# Patient Record
Sex: Female | Born: 1970 | Race: Black or African American | Hispanic: No | Smoking: Never smoker
Health system: Southern US, Community
[De-identification: ages and names within clinical notes are randomized; demographics above are authoritative.]

## PROBLEM LIST (undated history)

## (undated) DIAGNOSIS — I10 Essential (primary) hypertension: Secondary | ICD-10-CM

## (undated) DIAGNOSIS — T7840XA Allergy, unspecified, initial encounter: Secondary | ICD-10-CM

## (undated) HISTORY — PX: TUBAL LIGATION: SHX77

## (undated) HISTORY — DX: Allergy, unspecified, initial encounter: T78.40XA

## (undated) HISTORY — DX: Essential (primary) hypertension: I10

---

## 1997-11-20 ENCOUNTER — Ambulatory Visit (HOSPITAL_COMMUNITY): Admission: RE | Admit: 1997-11-20 | Discharge: 1997-11-20 | Payer: Self-pay | Admitting: *Deleted

## 1998-11-19 ENCOUNTER — Other Ambulatory Visit: Admission: RE | Admit: 1998-11-19 | Discharge: 1998-11-19 | Payer: Self-pay | Admitting: *Deleted

## 2000-02-04 ENCOUNTER — Other Ambulatory Visit: Admission: RE | Admit: 2000-02-04 | Discharge: 2000-02-04 | Payer: Self-pay | Admitting: *Deleted

## 2001-06-04 ENCOUNTER — Emergency Department (HOSPITAL_COMMUNITY): Admission: EM | Admit: 2001-06-04 | Discharge: 2001-06-04 | Payer: Self-pay | Admitting: Podiatry

## 2002-05-22 ENCOUNTER — Other Ambulatory Visit: Admission: RE | Admit: 2002-05-22 | Discharge: 2002-05-22 | Payer: Self-pay | Admitting: *Deleted

## 2003-01-27 ENCOUNTER — Encounter: Payer: Self-pay | Admitting: Internal Medicine

## 2003-12-09 ENCOUNTER — Ambulatory Visit: Payer: Self-pay | Admitting: Internal Medicine

## 2003-12-29 ENCOUNTER — Ambulatory Visit: Payer: Self-pay | Admitting: Internal Medicine

## 2004-02-04 ENCOUNTER — Other Ambulatory Visit: Admission: RE | Admit: 2004-02-04 | Discharge: 2004-02-04 | Payer: Self-pay | Admitting: Obstetrics and Gynecology

## 2005-03-10 ENCOUNTER — Other Ambulatory Visit: Admission: RE | Admit: 2005-03-10 | Discharge: 2005-03-10 | Payer: Self-pay | Admitting: Obstetrics and Gynecology

## 2005-03-31 ENCOUNTER — Encounter: Admission: RE | Admit: 2005-03-31 | Discharge: 2005-03-31 | Payer: Self-pay | Admitting: Obstetrics and Gynecology

## 2005-05-27 ENCOUNTER — Ambulatory Visit: Payer: Self-pay | Admitting: Internal Medicine

## 2006-06-20 ENCOUNTER — Encounter: Admission: RE | Admit: 2006-06-20 | Discharge: 2006-06-20 | Payer: Self-pay | Admitting: Physician Assistant

## 2006-07-14 ENCOUNTER — Other Ambulatory Visit: Admission: RE | Admit: 2006-07-14 | Discharge: 2006-07-14 | Payer: Self-pay | Admitting: Family Medicine

## 2006-12-05 ENCOUNTER — Encounter: Payer: Self-pay | Admitting: Internal Medicine

## 2006-12-05 DIAGNOSIS — J329 Chronic sinusitis, unspecified: Secondary | ICD-10-CM | POA: Insufficient documentation

## 2007-04-16 ENCOUNTER — Encounter: Admission: RE | Admit: 2007-04-16 | Discharge: 2007-04-16 | Payer: Self-pay | Admitting: Physician Assistant

## 2008-04-16 ENCOUNTER — Ambulatory Visit (HOSPITAL_COMMUNITY): Admission: RE | Admit: 2008-04-16 | Discharge: 2008-04-16 | Payer: Self-pay | Admitting: Family Medicine

## 2009-05-01 ENCOUNTER — Encounter: Admission: RE | Admit: 2009-05-01 | Discharge: 2009-05-01 | Payer: Self-pay | Admitting: Obstetrics & Gynecology

## 2009-07-30 ENCOUNTER — Emergency Department (HOSPITAL_COMMUNITY): Admission: EM | Admit: 2009-07-30 | Discharge: 2009-07-30 | Payer: Self-pay | Admitting: Emergency Medicine

## 2010-02-14 ENCOUNTER — Encounter: Payer: Self-pay | Admitting: Obstetrics and Gynecology

## 2010-03-30 ENCOUNTER — Other Ambulatory Visit: Payer: Self-pay | Admitting: Family Medicine

## 2010-03-30 DIAGNOSIS — Z1231 Encounter for screening mammogram for malignant neoplasm of breast: Secondary | ICD-10-CM

## 2010-04-11 LAB — URINALYSIS, ROUTINE W REFLEX MICROSCOPIC
Bilirubin Urine: NEGATIVE
Glucose, UA: NEGATIVE mg/dL
Ketones, ur: NEGATIVE mg/dL
Nitrite: NEGATIVE
Protein, ur: NEGATIVE mg/dL
Specific Gravity, Urine: 1.018 (ref 1.005–1.030)
Urobilinogen, UA: 0.2 mg/dL (ref 0.0–1.0)
pH: 6 (ref 5.0–8.0)

## 2010-04-11 LAB — POCT PREGNANCY, URINE: Preg Test, Ur: NEGATIVE

## 2010-04-11 LAB — URINE MICROSCOPIC-ADD ON

## 2010-05-13 ENCOUNTER — Ambulatory Visit
Admission: RE | Admit: 2010-05-13 | Discharge: 2010-05-13 | Disposition: A | Discharge status: Home / Self Care | Payer: BC Managed Care – PPO | Source: Ambulatory Visit | Attending: Family Medicine | Admitting: Family Medicine

## 2010-05-13 DIAGNOSIS — Z1231 Encounter for screening mammogram for malignant neoplasm of breast: Secondary | ICD-10-CM

## 2011-01-11 ENCOUNTER — Other Ambulatory Visit: Payer: Self-pay | Admitting: Physician Assistant

## 2011-01-11 ENCOUNTER — Other Ambulatory Visit (HOSPITAL_COMMUNITY)
Admission: RE | Admit: 2011-01-11 | Discharge: 2011-01-11 | Disposition: A | Discharge status: Home / Self Care | Payer: BC Managed Care – PPO | Source: Ambulatory Visit | Attending: Family Medicine | Admitting: Family Medicine

## 2011-01-11 DIAGNOSIS — Z01419 Encounter for gynecological examination (general) (routine) without abnormal findings: Secondary | ICD-10-CM | POA: Insufficient documentation

## 2011-07-02 IMAGING — CR DG LUMBAR SPINE COMPLETE 4+V
5 series · 5 of 5 positions shown · non-contrast
Comparison: None.

CLINICAL DATA: Low back pain secondary to a fall down stairs.

LUMBAR SPINE - COMPLETE 4+ VIEW

[t l-spine a.p.]
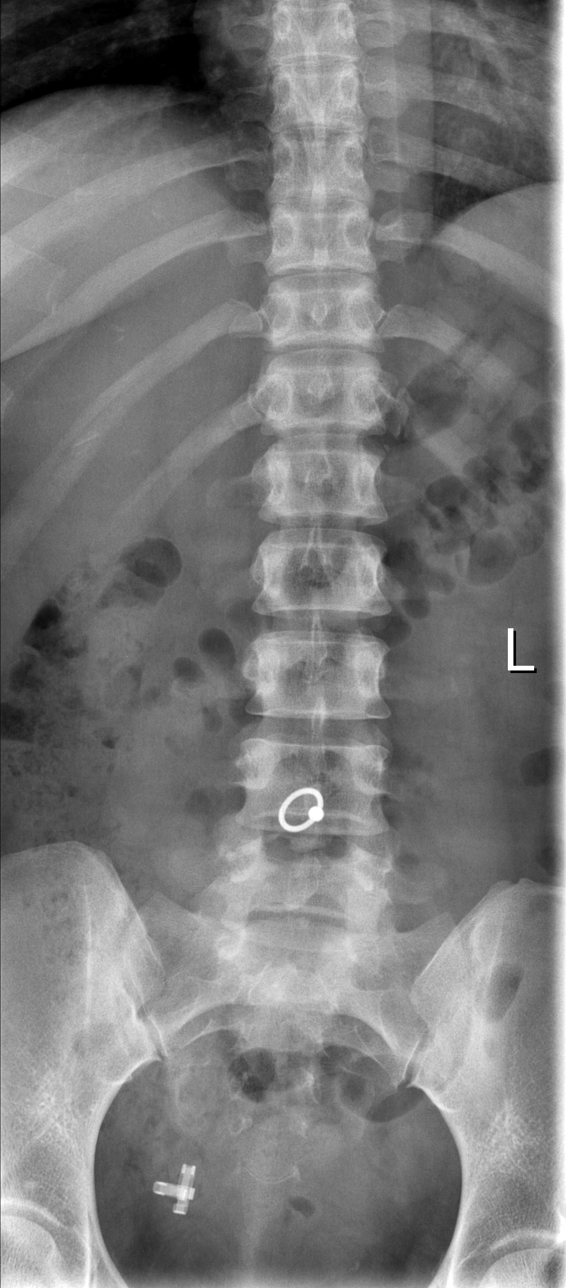

[t l-spine oblique exposure (1 of 2)]
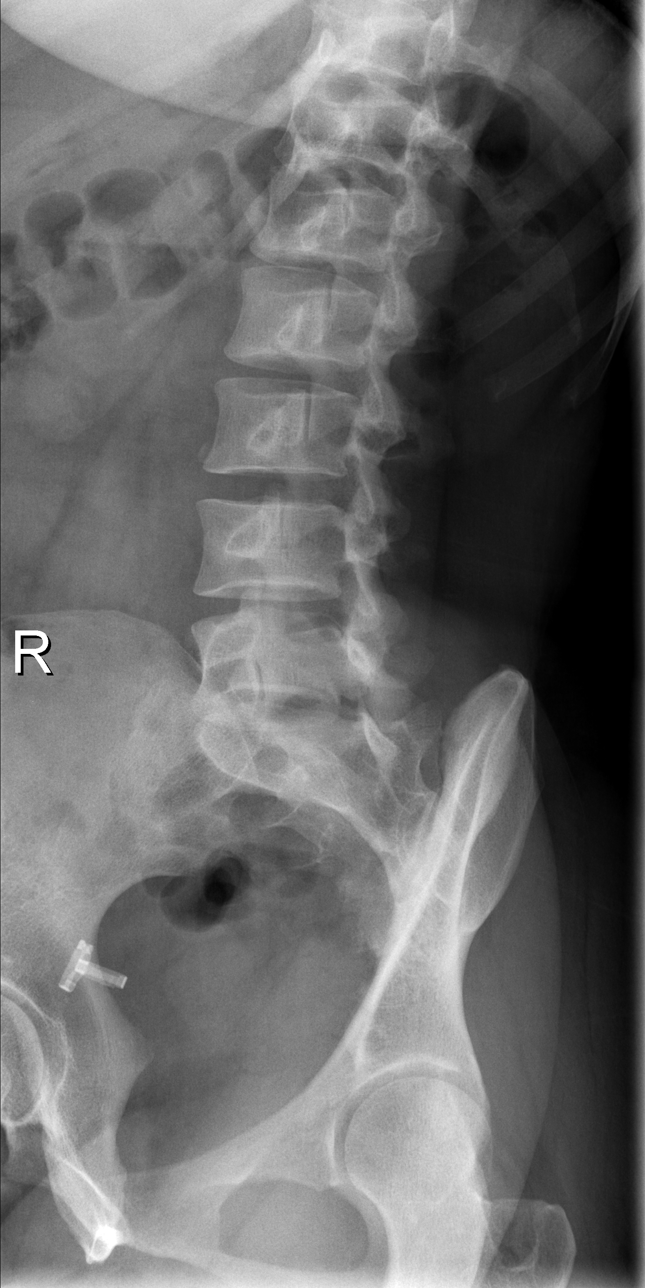

[t l-spine oblique exposure (2 of 2)]
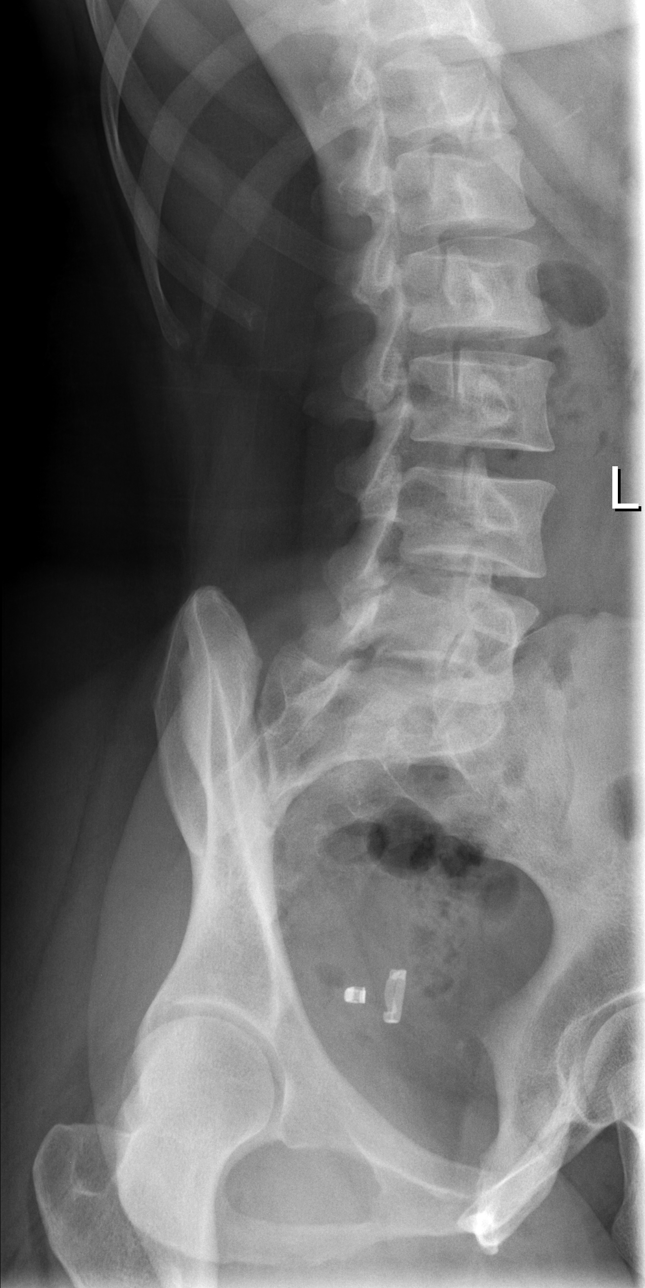

[t l-spine lat]
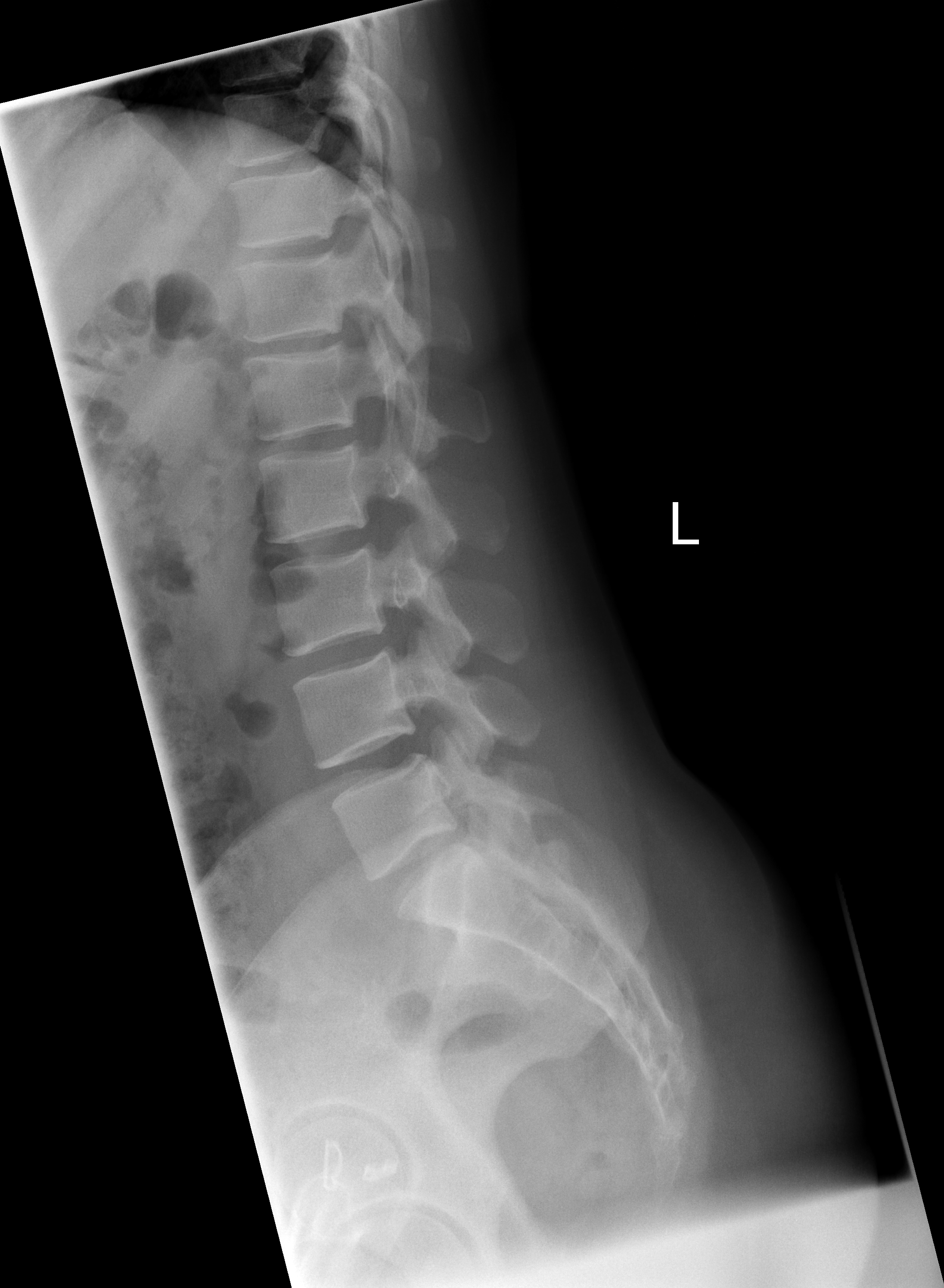

[t l-spine l5-s1 spot]
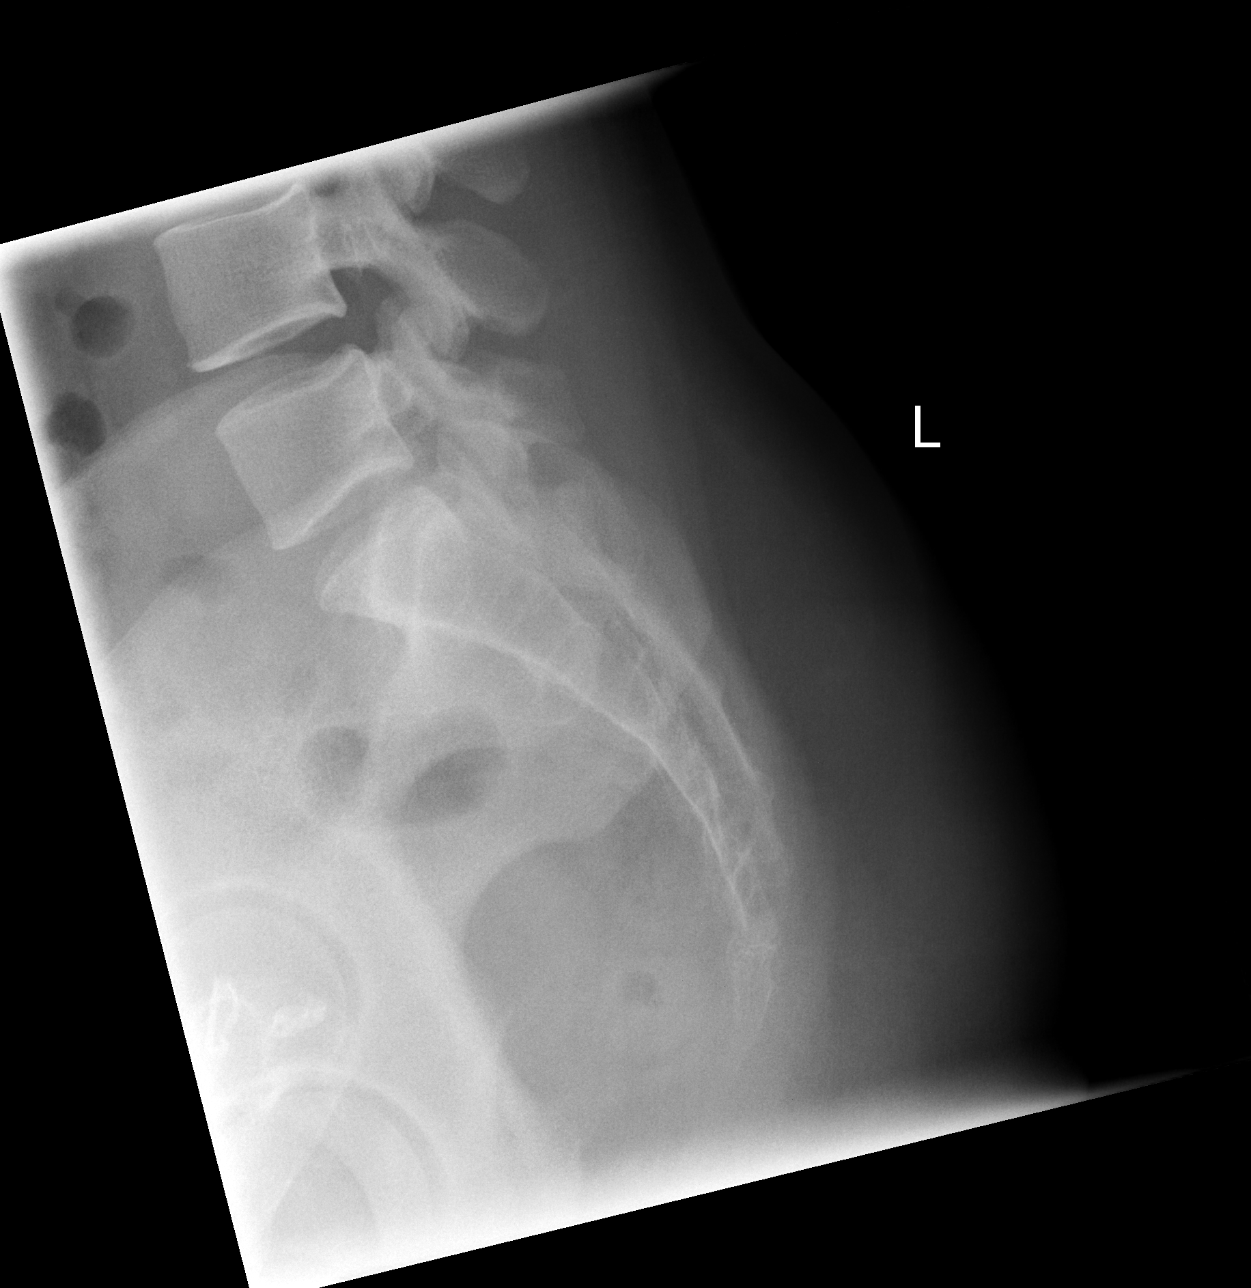

[5 of 5 positions shown; findings below may reference images not displayed]

FINDINGS: There is no fracture, disc space narrowing, facet
disease, or other significant abnormalities.
IMPRESSION: Normal lumbar spine.

## 2011-07-02 IMAGING — CR DG THORACIC SPINE 2V
3 series · 3 of 3 positions shown · non-contrast
Comparison: None.

CLINICAL DATA: Back pain secondary to falling down stairs.

THORACIC SPINE - 2 VIEW

[t t-spine a.p.]
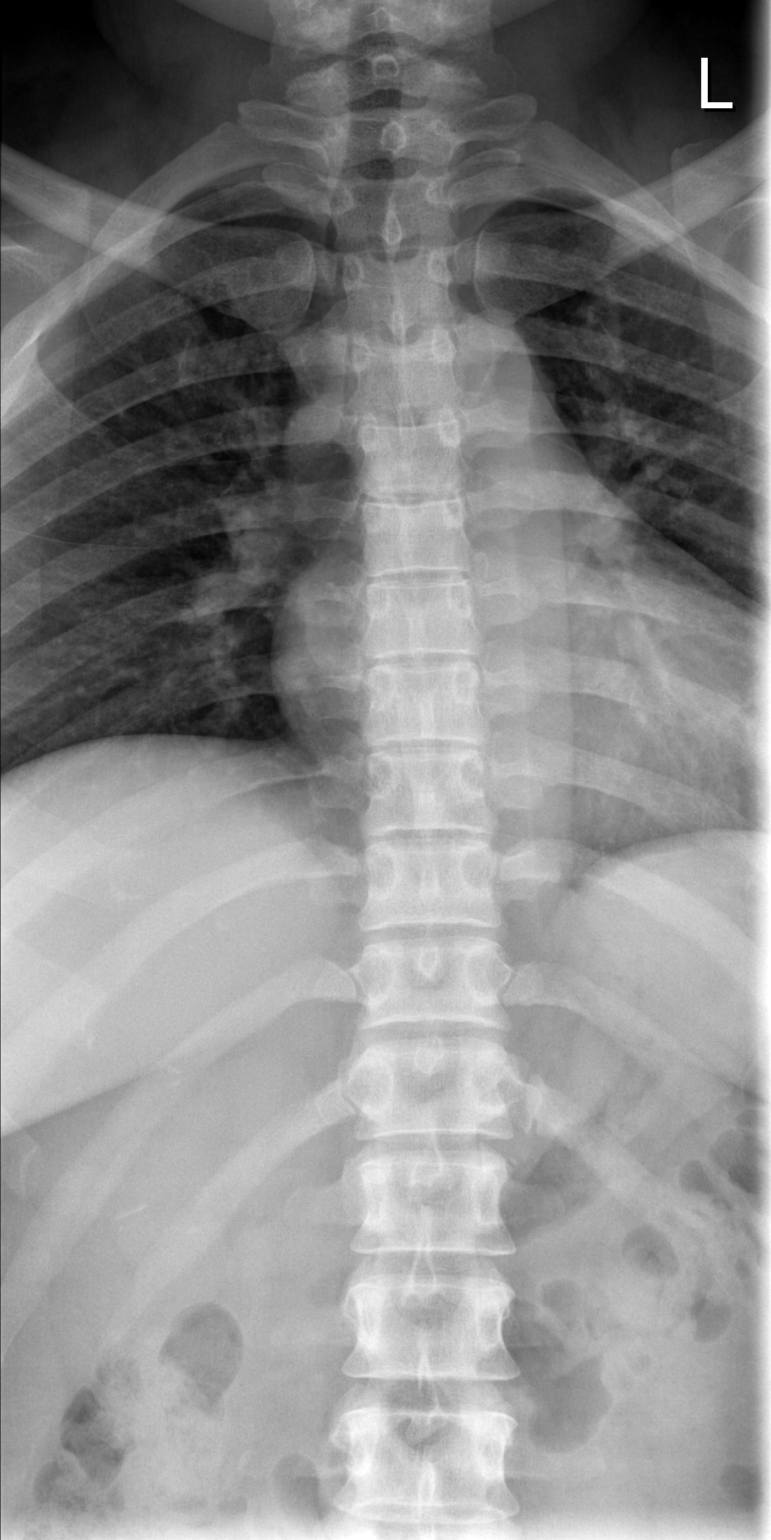

[t t-spine lat]
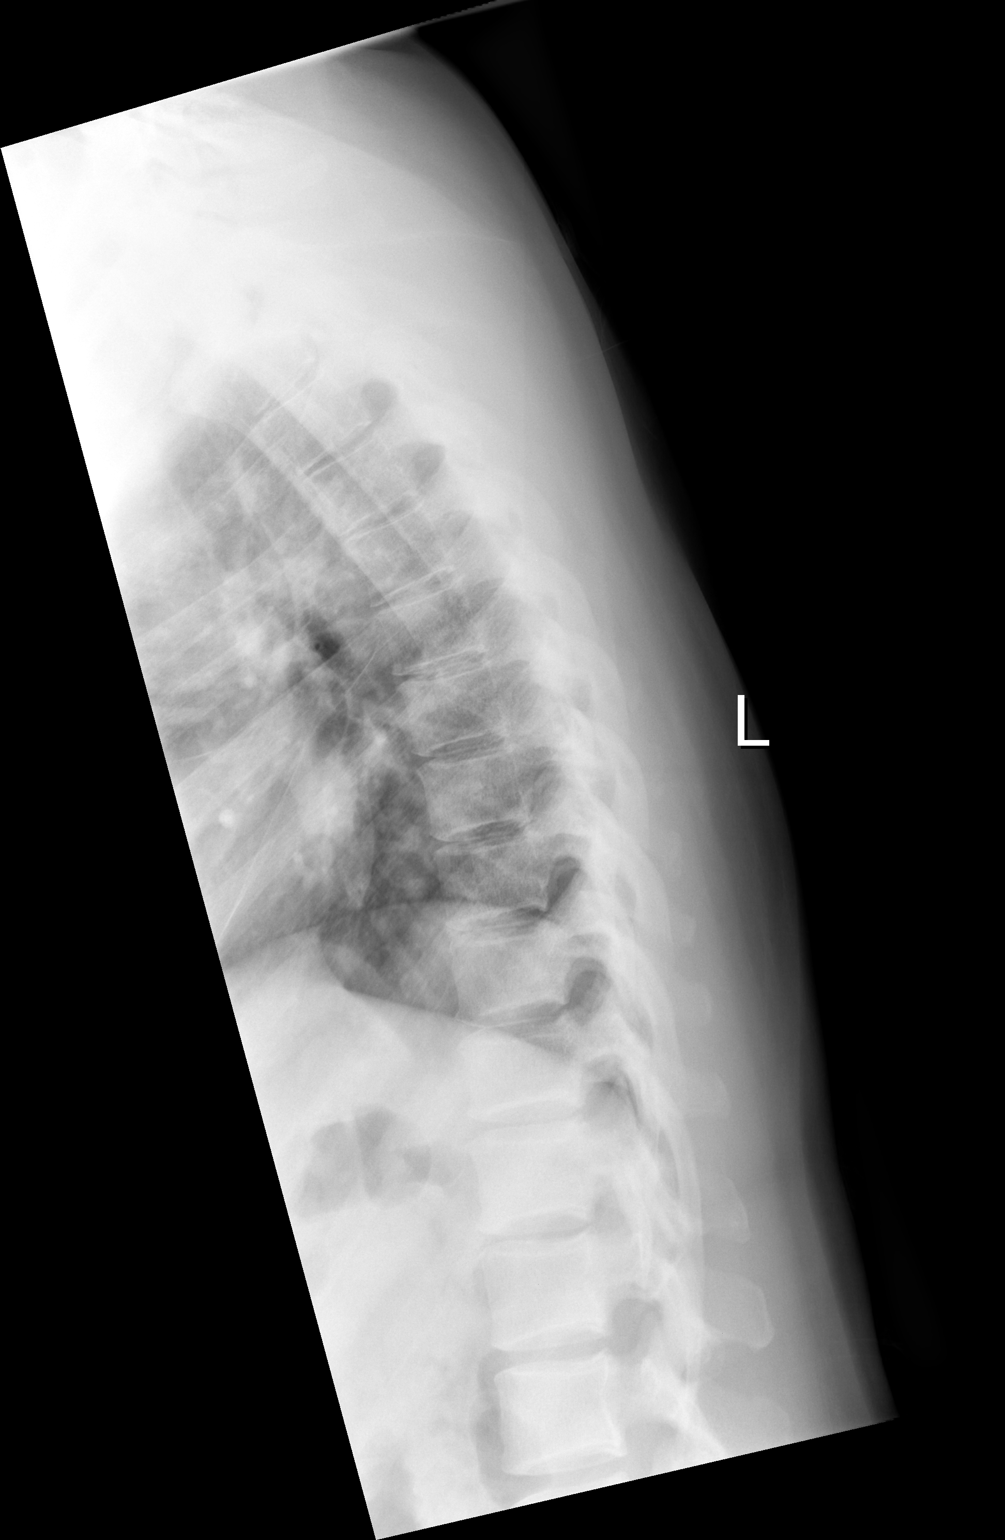

[t swimmers]
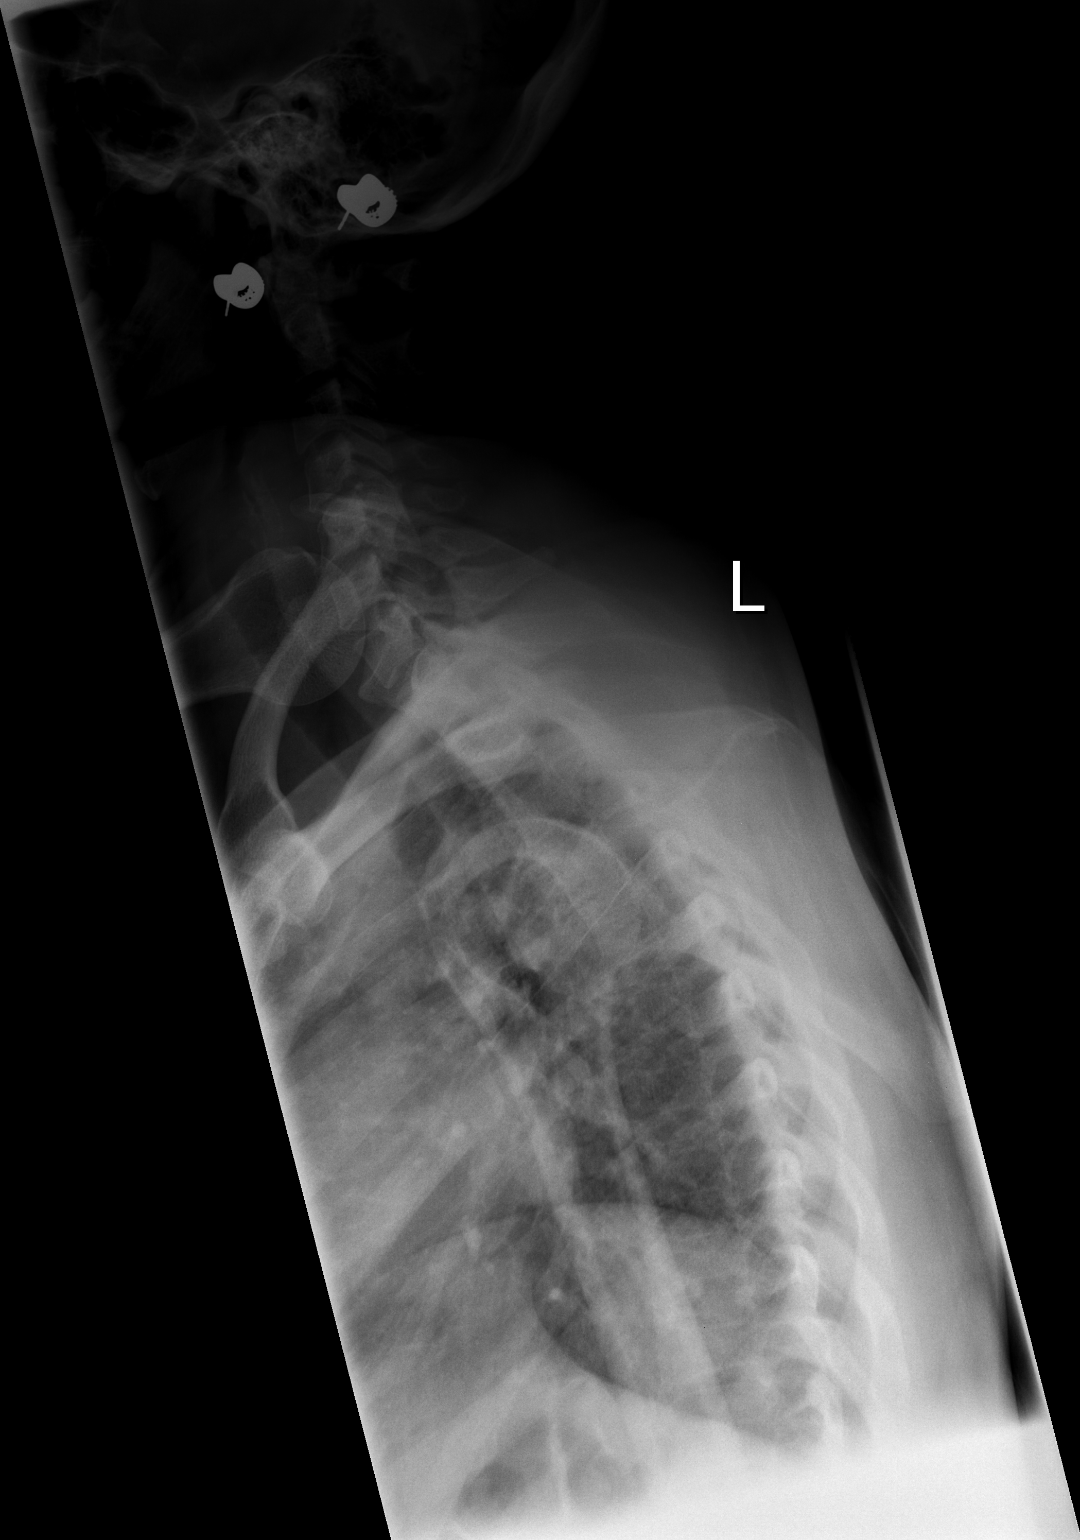

[3 of 3 positions shown; findings below may reference images not displayed]

FINDINGS: There is no fracture, subluxation, disc space narrowing,
or other significant abnormality.
IMPRESSION: Normal exam.

## 2012-04-18 ENCOUNTER — Other Ambulatory Visit: Payer: Self-pay | Admitting: Physician Assistant

## 2012-04-18 ENCOUNTER — Other Ambulatory Visit (HOSPITAL_COMMUNITY)
Admission: RE | Admit: 2012-04-18 | Discharge: 2012-04-18 | Disposition: A | Discharge status: Home / Self Care | Payer: BC Managed Care – PPO | Source: Ambulatory Visit | Attending: Family Medicine | Admitting: Family Medicine

## 2012-04-18 DIAGNOSIS — Z124 Encounter for screening for malignant neoplasm of cervix: Secondary | ICD-10-CM | POA: Insufficient documentation

## 2012-10-30 ENCOUNTER — Other Ambulatory Visit: Payer: Self-pay

## 2012-10-30 DIAGNOSIS — Z1231 Encounter for screening mammogram for malignant neoplasm of breast: Secondary | ICD-10-CM

## 2012-11-16 ENCOUNTER — Ambulatory Visit: Payer: BC Managed Care – PPO

## 2013-03-05 ENCOUNTER — Ambulatory Visit: Payer: BC Managed Care – PPO | Admitting: Emergency Medicine

## 2013-03-05 VITALS — BP 132/82 | HR 74 | Temp 99.2°F | Resp 17 | Ht 59.5 in | Wt 140.0 lb

## 2013-03-05 DIAGNOSIS — B9789 Other viral agents as the cause of diseases classified elsewhere: Secondary | ICD-10-CM

## 2013-03-05 DIAGNOSIS — J209 Acute bronchitis, unspecified: Secondary | ICD-10-CM

## 2013-03-05 DIAGNOSIS — B349 Viral infection, unspecified: Secondary | ICD-10-CM

## 2013-03-05 MED ORDER — GUAIFENESIN-CODEINE 100-10 MG/5ML PO SOLN: 10.0000 mL | Freq: Four times a day (QID) | ORAL | Status: DC | PRN

## 2013-03-05 NOTE — Progress Notes (Signed)
   Subjective:    Patient ID: Tammy Martinez, female    DOB: 08-Mar-1970, 43 y.o.   MRN: [id number]  HPI Cough and chest congestion for past 4 days.  Also with sore throat and "lost voice".  Cough productive of greenish sputum.  Positive runny nose and generalized fatigue.  No chest pain.  No nausea or vomiting.  No abdominal pain.  PPMH:  Sinusitis and Hypertension  SH:  Nonsmoker, no alcohol.   Review of Systems  Constitutional: Negative for fever and chills.  HENT: Positive for postnasal drip, rhinorrhea, sore throat and voice change.   Respiratory: Positive for cough. Negative for chest tightness, shortness of breath and wheezing.   Cardiovascular: Negative for chest pain and leg swelling.  Gastrointestinal: Negative for nausea, vomiting, diarrhea and constipation.       Objective:   Physical Exam Blood pressure 132/82, pulse 74, temperature 99.2 F (37.3 C), temperature source Oral, resp. rate 17, height 4' 11.5" (1.511 m), weight 140 lb (63.504 kg), last menstrual period 02/12/2013, SpO2 99.00%. Body mass index is 27.81 kg/(m^2). Well-developed, well nourished female who is awake, alert and oriented, in NAD. HEENT: [place]/AT, PERRL, EOMI.  Sclera and conjunctiva are clear.  EAC are patent, TMs are normal in appearance. Nasal mucosa is edematous. OP is erythematous. Neck: supple, non-tender, no lymphadenopathy, thyromegaly. Heart: RRR, no murmur Lungs: normal effort, CTA Abdomen: normo-active bowel sounds, supple, non-tender, no mass or organomegaly. Extremities: no cyanosis, clubbing or edema. Skin: warm and dry without rash. Psychologic: good mood and appropriate affect, normal speech and behavior.      Assessment & Plan:  Viral URI  Treat with codeine cough syrup.  Follow up as needed.  Patient declined work excuse.

## 2013-04-19 ENCOUNTER — Other Ambulatory Visit (HOSPITAL_COMMUNITY)
Admission: RE | Admit: 2013-04-19 | Discharge: 2013-04-19 | Disposition: A | Discharge status: Home / Self Care | Payer: BC Managed Care – PPO | Source: Ambulatory Visit | Attending: Physician Assistant | Admitting: Physician Assistant

## 2013-04-19 DIAGNOSIS — Z1151 Encounter for screening for human papillomavirus (HPV): Secondary | ICD-10-CM | POA: Insufficient documentation

## 2013-04-19 DIAGNOSIS — Z124 Encounter for screening for malignant neoplasm of cervix: Secondary | ICD-10-CM | POA: Insufficient documentation

## 2013-04-22 ENCOUNTER — Other Ambulatory Visit: Payer: Self-pay | Admitting: Physician Assistant

## 2013-05-16 ENCOUNTER — Ambulatory Visit
Admission: RE | Admit: 2013-05-16 | Discharge: 2013-05-16 | Disposition: A | Discharge status: Home / Self Care | Payer: Self-pay | Source: Ambulatory Visit

## 2013-05-16 ENCOUNTER — Encounter (INDEPENDENT_AMBULATORY_CARE_PROVIDER_SITE_OTHER): Payer: Self-pay

## 2013-05-16 DIAGNOSIS — Z1231 Encounter for screening mammogram for malignant neoplasm of breast: Secondary | ICD-10-CM

## 2014-10-20 ENCOUNTER — Ambulatory Visit (INDEPENDENT_AMBULATORY_CARE_PROVIDER_SITE_OTHER): Payer: BLUE CROSS/BLUE SHIELD | Admitting: Physician Assistant

## 2014-10-20 VITALS — BP 148/78 | HR 80 | Temp 98.9°F | Resp 16 | Ht 59.5 in | Wt 128.5 lb

## 2014-10-20 DIAGNOSIS — Q829 Congenital malformation of skin, unspecified: Secondary | ICD-10-CM | POA: Diagnosis not present

## 2014-10-20 DIAGNOSIS — L858 Other specified epidermal thickening: Secondary | ICD-10-CM

## 2014-10-20 DIAGNOSIS — R030 Elevated blood-pressure reading, without diagnosis of hypertension: Secondary | ICD-10-CM

## 2014-10-20 DIAGNOSIS — IMO0001 Reserved for inherently not codable concepts without codable children: Secondary | ICD-10-CM

## 2014-10-20 NOTE — Progress Notes (Signed)
   10/20/2014 at 7:52 PM  Tammy Martinez / DOB: [birth year] / MRN: [id number]  The patient has SINUSITIS on her problem list.  SUBJECTIVE  Tammy Martinez is a 44 y.o. well appearing female presenting for the chief complaint of rash on her upper arms and upper back.  Reports that she noticed this 3 days ago and states that it is mildly itchy at time.  She has never had this problem before.  She reports that she just started a new exercise regimen and does not shower after exercise sessions because she does this on her lunch break.     She reports a history of HTN and is taking 20 mg lisinopril daily.    She  has a past medical history of Allergy and Hypertension.    Medications reviewed and updated by myself where necessary, and exist elsewhere in the encounter.   Ms. Martinez has No Known Allergies. She  reports that she has never smoked. She has never used smokeless tobacco. She reports that she does not drink alcohol or use illicit drugs. She  reports that she does not engage in sexual activity. The patient  has past surgical history that includes Tubal ligation.  Her family history includes Breast cancer in her mother; Hyperlipidemia in her brother, father, and mother; Hypertension in her brother, father, and mother.  Review of Systems  Constitutional: Negative for fever and chills.  Respiratory: Negative for cough.   Cardiovascular: Negative for chest pain.  Gastrointestinal: Negative for nausea.  Genitourinary: Negative.  Negative for dysuria and urgency.  Musculoskeletal: Negative for myalgias.  Skin: Positive for itching and rash.  Neurological: Negative for dizziness and headaches.    OBJECTIVE  Her  height is 4' 11.5" (1.511 m) and weight is 128 lb 8 oz (58.287 kg). Her oral temperature is 98.9 F (37.2 C). Her blood pressure is 148/78 and her pulse is 80. Her respiration is 16 and oxygen saturation is 98%.  The patient's body mass index is 25.53 kg/(m^2).  Physical Exam    Constitutional: She is oriented to person, place, and time. She appears well-developed and well-nourished. No distress.  Eyes: Pupils are equal, round, and reactive to light.  Cardiovascular: Normal rate.   Respiratory: Effort normal. No respiratory distress.  GI: She exhibits no distension.  Neurological: She is alert and oriented to person, place, and time.  Skin: Skin is warm and dry. Rash noted. She is not diaphoretic.     Psychiatric: She has a normal mood and affect. Her behavior is normal. Judgment and thought content normal.    No results found for this or any previous visit (from the past 24 hour(s)).  ASSESSMENT & PLAN  Shervon was seen today for rash and vaginitis.  Diagnoses and all orders for this visit:  Keratosis pilaris: Mild.  Advised showering after exercise.  Advised Zyrtec for itching along with hydrocortisone cream OTC if the rash persist.     Elevated BP: Patient advised to follow up with her PCP.    The patient was advised to call or come back to clinic if she does not see an improvement in symptoms, or worsens with the above plan.   Deliah Boston, MHS, PA-C [facility] 10/20/2014 7:52 PM

## 2014-10-20 NOTE — Patient Instructions (Signed)
Apply hydrocortisone cream as needed in small amount of the affected areas only. Take zyrtec for itching.  Please shower after exercise every time.

## 2015-09-30 ENCOUNTER — Other Ambulatory Visit (HOSPITAL_COMMUNITY)
Admission: RE | Admit: 2015-09-30 | Discharge: 2015-09-30 | Disposition: A | Discharge status: Home / Self Care | Payer: Managed Care, Other (non HMO) | Source: Ambulatory Visit | Attending: Family Medicine | Admitting: Family Medicine

## 2015-09-30 ENCOUNTER — Other Ambulatory Visit: Payer: Self-pay | Admitting: Physician Assistant

## 2015-09-30 DIAGNOSIS — Z124 Encounter for screening for malignant neoplasm of cervix: Secondary | ICD-10-CM | POA: Diagnosis present

## 2015-10-01 ENCOUNTER — Other Ambulatory Visit: Payer: Self-pay | Admitting: Physician Assistant

## 2015-10-01 DIAGNOSIS — Z1231 Encounter for screening mammogram for malignant neoplasm of breast: Secondary | ICD-10-CM

## 2015-10-05 LAB — CYTOLOGY - PAP

## 2015-10-13 ENCOUNTER — Ambulatory Visit
Admission: RE | Admit: 2015-10-13 | Discharge: 2015-10-13 | Disposition: A | Discharge status: Home / Self Care | Payer: Managed Care, Other (non HMO) | Source: Ambulatory Visit | Attending: Physician Assistant | Admitting: Physician Assistant

## 2015-10-13 DIAGNOSIS — Z1231 Encounter for screening mammogram for malignant neoplasm of breast: Secondary | ICD-10-CM

## 2017-05-29 ENCOUNTER — Other Ambulatory Visit: Payer: Self-pay | Admitting: Physician Assistant

## 2017-05-29 DIAGNOSIS — Z1231 Encounter for screening mammogram for malignant neoplasm of breast: Secondary | ICD-10-CM

## 2017-06-12 ENCOUNTER — Ambulatory Visit: Payer: Managed Care, Other (non HMO)

## 2017-09-20 ENCOUNTER — Ambulatory Visit
Admission: RE | Admit: 2017-09-20 | Discharge: 2017-09-20 | Disposition: A | Discharge status: Home / Self Care | Payer: 59 | Source: Ambulatory Visit | Attending: Physician Assistant | Admitting: Physician Assistant

## 2017-09-20 DIAGNOSIS — Z1231 Encounter for screening mammogram for malignant neoplasm of breast: Secondary | ICD-10-CM

## 2020-04-02 ENCOUNTER — Other Ambulatory Visit: Payer: Self-pay | Admitting: Nurse Practitioner

## 2020-04-02 DIAGNOSIS — Z1231 Encounter for screening mammogram for malignant neoplasm of breast: Secondary | ICD-10-CM

## 2021-11-25 ENCOUNTER — Other Ambulatory Visit: Payer: Self-pay | Admitting: Nurse Practitioner

## 2021-11-25 DIAGNOSIS — Z1231 Encounter for screening mammogram for malignant neoplasm of breast: Secondary | ICD-10-CM

## 2022-04-21 ENCOUNTER — Ambulatory Visit
Admission: RE | Admit: 2022-04-21 | Discharge: 2022-04-21 | Disposition: A | Discharge status: Home / Self Care | Payer: BC Managed Care – PPO | Source: Ambulatory Visit | Attending: Nurse Practitioner | Admitting: Nurse Practitioner

## 2022-04-21 DIAGNOSIS — Z1231 Encounter for screening mammogram for malignant neoplasm of breast: Secondary | ICD-10-CM

## 2022-04-25 ENCOUNTER — Other Ambulatory Visit: Payer: Self-pay | Admitting: Nurse Practitioner

## 2022-04-25 DIAGNOSIS — R928 Other abnormal and inconclusive findings on diagnostic imaging of breast: Secondary | ICD-10-CM

## 2022-05-11 ENCOUNTER — Other Ambulatory Visit: Payer: BC Managed Care – PPO

## 2022-05-11 ENCOUNTER — Ambulatory Visit
Admission: RE | Admit: 2022-05-11 | Discharge: 2022-05-11 | Disposition: A | Discharge status: Home / Self Care | Payer: BC Managed Care – PPO | Source: Ambulatory Visit | Attending: Nurse Practitioner | Admitting: Nurse Practitioner

## 2022-05-11 DIAGNOSIS — R928 Other abnormal and inconclusive findings on diagnostic imaging of breast: Secondary | ICD-10-CM

## 2022-05-16 ENCOUNTER — Other Ambulatory Visit: Payer: Self-pay | Admitting: Nurse Practitioner

## 2022-05-16 DIAGNOSIS — R599 Enlarged lymph nodes, unspecified: Secondary | ICD-10-CM
